# Patient Record
Sex: Male | Born: 1990 | Race: Black or African American | Hispanic: No | Marital: Married | Smoking: Never smoker
Health system: Southern US, Community
[De-identification: ages and names within clinical notes are randomized; demographics above are authoritative.]

## PROBLEM LIST (undated history)

## (undated) HISTORY — PX: HERNIA REPAIR: SHX51

---
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
## 2011-01-31 NOTE — Discharge Summary (Signed)
NAME:  Kevin Clayton, Kevin Clayton           ACCOUNT NO.:  [id number]   MEDICAL RECORD NO.:  [id number]          PATIENT TYPE:  AMB   LOCATION:  DSC                          FACILITY:  [facility]   PHYSICIAN:  Prabhakar D. Pendse, M.D.DATE OF BIRTH:  [birth year]   DATE OF ADMISSION:  10/07/2005  DATE OF DISCHARGE:  10/08/2005                                 DISCHARGE SUMMARY   REASON FOR HOSPITALIZATION:  Tachypnea, tachycardia, and hypoxia in a 20-  year-old status post umbilical hernia repair with general anesthesia and  pulmonary edema on chest x-ray postoperatively.  Ambulatory O2 saturation  are 83%.   SIGNIFICANT FINDINGS:  The patient was a direct admit from day surgery.  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  The patient is seen by Dr. Hyacinth Meeker at [facility] and is up to  date on all of his immunizations.  The patient initially had a 2 L/min nasal  cannula O2 requirement on arrival to the floor in order to keep O2  saturations greater than 92%, but was able to be weaned down to 0.5 L/min  within a few hours of arrival.  The patient was held for 23-hour observation  and had an uneventful overnight course.  In the a.m., the patient was able  to ambulate on room air with O2 saturations of 98%.  Pulmonary auscultation  was significant on arrival for clear to auscultation bilaterally, but the  patient expressed fatigue and mild shortness of breath.  The patient's  auscultatory exam in the a.m. was unchanged and clear, but the patient's  complaint of fatigue and shortness of breath had passed.  The patient had a  chest x-ray done in day surgery which was significant for pulmonary edema.  Chest x-ray was not repeated primarily based on the patient's good clinical  exam and stable O2 saturations the next morning.  The patient was discharged  home with close followup with Dr.  Levie Heritage for postop care and with Dr. Hyacinth Meeker  at [facility].   TREATMENT:  Nasal O2 and bed rest.   OPERATIONS AND PROCEDURES:  None.   FINAL DIAGNOSES:  1.  Flash pulmonary edema.  2.  Transient volume overload.  3.  Status post umbilical hernia repair with general anesthesia.   DISCHARGE MEDICATIONS AND INSTRUCTIONS:  No discharge medications.   Instructions are to return to clinic if shortness of breath returns and for  close followup with Dr. Levie Heritage for surgery and Dr. Hyacinth Meeker with [facility].  Please call for an appointment.   PENDING RESULTS AND ISSUES TO BE FOLLOWED UP:  1.  Pulmonary status.  2.  Postop care.   FOLLOW UP:  1.  Dr. Levie Heritage.  2.  Dr. Hyacinth Meeker of [facility].   DISCHARGE WEIGHT:  61.81 kg.   DISCHARGE CONDITION:  Improved.      Towana Badger, M.D.    ______________________________  Hyman Bible. Levie Heritage, M.D.    JP/MEDQ  D:  10/08/2005  T:  10/08/2005  Job:  [id number] 

## 2011-01-31 NOTE — Op Note (Signed)
NAME:  Kevin Clayton, Kevin Clayton           ACCOUNT NO.:  [id number]   MEDICAL RECORD NO.:  [id number]          PATIENT TYPE:  AMB   LOCATION:  DSC                          FACILITY:  [facility]   PHYSICIAN:  Prabhakar D. Pendse, M.D.DATE OF BIRTH:  [birth year]   DATE OF PROCEDURE:  10/07/2005  DATE OF DISCHARGE:                                 OPERATIVE REPORT   PREOPERATIVE DIAGNOSIS:  Umbilical hernia.   POSTOPERATIVE DIAGNOSIS:  Umbilical hernia.   OPERATION PERFORMED:  Repair of umbilical hernia.   SURGEON:  Prabhakar D. Levie Heritage, M.D.   ASSISTANT:  Nurse.   ANESTHESIA:  Nurse   OPERATIVE PROCEDURE:  Under satisfactory general endotracheal anesthesia  with the patient in supine position, the abdomen was thoroughly prepped and  draped in the usual manner. A curvilinear infraumbilical incision was made,  the skin and subcutaneous tissues incised. Bleeders were individually,  clamped, cut and electrocoagulated. Blunt and sharp dissection was carried  out to isolate the umbilical hernia sac. Then the neck of the sac was  opened, umbilical fascia defect was repaired in one layer with #0 Vicryl  vertical mattress sutures. Satisfactory repair was accomplished excess of  the umbilical hernia sac was excised.   Hemostasis accomplished. 1/4% Marcaine with epinephrine was injected locally  for postop analgesia. Subcutaneous tissue closed with 4-0 Vicryl, skin  closed with 4-0 Monocryl subcuticular sutures. Appropriate dressing applied.  Throughout the procedure the patient's vital signs remained stable. The  patient withstood the procedure well and was transferred to recovery room in  satisfactory general condition.           ______________________________  Hyman Bible Levie Heritage, M.D.     PDP/MEDQ  D:  10/07/2005  T:  10/07/2005  Job:  [id number]   cc:   Netta Cedars, M.D.

## 2017-03-30 ENCOUNTER — Emergency Department (HOSPITAL_COMMUNITY)
Admission: EM | Admit: 2017-03-30 | Discharge: 2017-03-30 | Disposition: A | Discharge status: Home / Self Care | Payer: BLUE CROSS/BLUE SHIELD | Attending: Emergency Medicine | Admitting: Emergency Medicine

## 2017-03-30 ENCOUNTER — Encounter (HOSPITAL_COMMUNITY): Payer: Self-pay | Admitting: *Deleted

## 2017-03-30 DIAGNOSIS — F4321 Adjustment disorder with depressed mood: Secondary | ICD-10-CM | POA: Diagnosis present

## 2017-03-30 DIAGNOSIS — Z7289 Other problems related to lifestyle: Secondary | ICD-10-CM | POA: Diagnosis not present

## 2017-03-30 LAB — COMPREHENSIVE METABOLIC PANEL
ALBUMIN: 4.6 g/dL (ref 3.5–5.0)
ALK PHOS: 46 U/L (ref 38–126)
ALT: 44 U/L (ref 17–63)
ANION GAP: 11 (ref 5–15)
AST: 26 U/L (ref 15–41)
BUN: 14 mg/dL (ref 6–20)
CALCIUM: 9.5 mg/dL (ref 8.9–10.3)
CO2: 23 mmol/L (ref 22–32)
Chloride: 105 mmol/L (ref 101–111)
Creatinine, Ser: 1.22 mg/dL (ref 0.61–1.24)
GFR calc non Af Amer: >60 mL/min (ref >60)
GLUCOSE: 96 mg/dL (ref 65–99)
POTASSIUM: 3.9 mmol/L (ref 3.5–5.1)
SODIUM: 139 mmol/L (ref 135–145)
Total Bilirubin: 0.4 mg/dL (ref 0.3–1.2)
Total Protein: 8.2 g/dL — ABNORMAL HIGH (ref 6.5–8.1)

## 2017-03-30 LAB — CBC
HEMATOCRIT: 46.6 % (ref 39.0–52.0)
Hemoglobin: 15.7 g/dL (ref 13.0–17.0)
MCH: 26.3 pg (ref 26.0–34.0)
MCHC: 33.7 g/dL (ref 30.0–36.0)
MCV: 77.9 fL — ABNORMAL LOW (ref 78.0–100.0)
Platelets: 289 10*3/uL (ref 150–400)
RBC: 5.98 MIL/uL — ABNORMAL HIGH (ref 4.22–5.81)
RDW: 14 % (ref 11.5–15.5)
WBC: 6.3 10*3/uL (ref 4.0–10.5)

## 2017-03-30 LAB — RAPID URINE DRUG SCREEN, HOSP PERFORMED
Amphetamines: NOT DETECTED
BENZODIAZEPINES: NOT DETECTED
Barbiturates: NOT DETECTED
COCAINE: NOT DETECTED
OPIATES: NOT DETECTED
Tetrahydrocannabinol: NOT DETECTED

## 2017-03-30 LAB — ACETAMINOPHEN LEVEL

## 2017-03-30 LAB — ETHANOL: Alcohol, Ethyl (B): 21 mg/dL — ABNORMAL HIGH (ref <5)

## 2017-03-30 LAB — SALICYLATE LEVEL

## 2017-03-30 MED ORDER — BACITRACIN ZINC 500 UNIT/GM EX OINT
TOPICAL_OINTMENT | Freq: Once | CUTANEOUS | Status: AC
  Administered 2017-03-30: 1 via TOPICAL
  Filled 2017-03-30: qty 28.35

## 2017-03-30 MED ORDER — IBUPROFEN 200 MG PO TABS: 600.0000 mg | ORAL_TABLET | Freq: Three times a day (TID) | ORAL | Status: DC | PRN

## 2017-03-30 MED ORDER — ALUM & MAG HYDROXIDE-SIMETH 200-200-20 MG/5ML PO SUSP: 30.0000 mL | Freq: Four times a day (QID) | ORAL | Status: DC | PRN

## 2017-03-30 MED ORDER — ONDANSETRON HCL 4 MG PO TABS: 4.0000 mg | ORAL_TABLET | Freq: Three times a day (TID) | ORAL | Status: DC | PRN

## 2017-03-30 MED ORDER — ZOLPIDEM TARTRATE 5 MG PO TABS: 5.0000 mg | ORAL_TABLET | Freq: Every evening | ORAL | Status: DC | PRN

## 2017-03-30 NOTE — ED Notes (Signed)
SBAR Report received from previous nurse. Pt received calm and visible on unit. Pt denies current SI/ HI, A/V H, depression, anxiety, or pain at this time, and appears otherwise stable and free of distress. Pt reminded of camera surveillance, q 15 min rounds, and rules of the milieu. Will continue to assess. 

## 2017-03-30 NOTE — BHH Suicide Risk Assessment (Addendum)
Suicide Risk Assessment  Discharge Assessment   [facility]   Principal Problem: Adjustment disorder with depressed mood Discharge Diagnoses:  Patient Active Problem List   Diagnosis Date Noted  . Adjustment disorder with depressed mood [F43.21] 03/30/2017    Priority: High    Total Time spent with patient: 45 minutes  Musculoskeletal: Strength & Muscle Tone: within normal limits Gait & Station: normal Patient leans: N/A  Psychiatric Specialty Exam:   Blood pressure 101/64, pulse 78, temperature 98.4 F (36.9 C), temperature source Oral, resp. rate 16, height 5\' 6"  (1.676 m), weight 77.1 kg (170 lb), SpO2 98 %.Body mass index is 27.44 kg/m.  General Appearance: Casual  Eye Contact::  Good  Speech:  Normal Rate409  Volume:  Normal  Mood:  Depressed, mild  Affect:  Congruent  Thought Process:  Coherent and Descriptions of Associations: Intact  Orientation:  Full (Time, Place, and Person)  Thought Content:  WDL and Logical  Suicidal Thoughts:  No  Homicidal Thoughts:  No  Memory:  Immediate;   Good Recent;   Good Remote;   Good  Judgement:  Fair  Insight:  Fair  Psychomotor Activity:  Normal  Concentration:  Good  Recall:  Good  Fund of Knowledge:Good  Language: Good  Akathisia:  No  Handed:  Right  AIMS (if indicated):     Assets:  Leisure Time Physical Health Resilience Social Support  Sleep:     Cognition: WNL  ADL's:  Intact   Mental Status Per Nursing Assessment::   On Admission:   26 yo male who came to the ED after getting upset and cutting himself, no stitches required.  Denies suicidal/homicidal ideations, hallucinations, or alcohol/drug abuse.  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  Evidently, he had one but it was cancelled at the last moment.  He lives with his husband, married a couple of months ago and usually discusses his problems with his husband  but did not this time.  Agreeable to counseling.  Stable for discharge.  Demographic Factors:  Male and Cardell PeachGay, lesbian, or bisexual orientation  Loss Factors: NA  Historical Factors: NA  Risk Reduction Factors:   Sense of responsibility to family, Living with another person, especially a relative and Positive social support  Continued Clinical Symptoms:  Depressed, mild  Cognitive Features That Contribute To Risk:  None    Suicide Risk:  Minimal: No identifiable suicidal ideation.  Patients presenting with no risk factors but with morbid ruminations; may be classified as minimal risk based on the severity of the depressive symptoms    Plan Of Care/Follow-up recommendations:  Activity:  as tolerated Diet:  heart healthy diet  LORD, JAMISON, NP 03/30/2017, 10:51 AM

## 2017-03-30 NOTE — BH Assessment (Signed)
BHH Assessment Progress Note  Per Thedore MinsMojeed Akintayo, MD, this pt does not require psychiatric hospitalization at this time.  [facility] with referral information for area therapists.  Discharge instructions include referrals for [facility] [place], for [facility], and for [facility].  Pt's nurse, Morrie Sheldonshley, has been notified.  Doylene Canninghomas Madigan Rosensteel, MA Triage Specialist [phone]

## 2017-03-30 NOTE — ED Provider Notes (Signed)
[facility]-EMERGENCY DEPT Provider Note   CSN: [id number] Arrival date & time: 03/30/17  0033  By signing my name below, I, Kevin Clayton, attest that this documentation has been prepared under the direction and in the presence of Gregory Barrick, Canary Brim, *. Electronically Signed: Deland Clayton, ED Scribe. 03/30/17. 12:56 AM.  History   Chief Complaint Chief Complaint  Patient presents with  . Suicidal   The history is provided by the patient. No language interpreter was used.   HPI Comments: Kevin Clayton is a 26 y.o. male who presents to the Emergency Department complaining of moderate left forearm pain s/p an episode of self-harm with associated suicidal ideations that occurred this afternoon. Per pt, his husband called EMS after this episode. The pt states that he is unsure as to why he cut himself, and reports that he would not try it again if he went home. The pt denies a h/x of hospitalization due to depression. He also denies fever.   History reviewed. No pertinent past medical history.  There are no active problems to display for this patient.   Past Surgical History:  Procedure Laterality Date  . HERNIA REPAIR         Home Medications    Prior to Admission medications   Not on File    Family History No family history on file.  Social History Social History  Substance Use Topics  . Smoking status: Never Smoker  . Smokeless tobacco: Never Used  . Alcohol use Yes     Comment: occ     Allergies   Patient has no allergy information on record.   Review of Systems Review of Systems  Psychiatric/Behavioral: Positive for self-injury and suicidal ideas.     Physical Exam Updated Vital Signs BP (!) 155/92 (BP Location: Left Arm)   Pulse (!) 109   Temp 98.7 F (37.1 C) (Oral)   Resp 20   Ht 5\' 6"  (1.676 m)   Wt 77.1 kg (170 lb)   SpO2 100%   BMI 27.44 kg/m   Physical Exam  Constitutional: He is oriented to person, place, and time. He  appears well-developed and well-nourished. No distress.  HENT:  Head: Normocephalic and atraumatic.  Right Ear: Hearing normal.  Left Ear: Hearing normal.  Nose: Nose normal.  Mouth/Throat: Oropharynx is clear and moist and mucous membranes are normal.  Eyes: Pupils are equal, round, and reactive to light. Conjunctivae and EOM are normal.  Neck: Normal range of motion. Neck supple.  Cardiovascular: Regular rhythm, S1 normal and S2 normal.  Exam reveals no gallop and no friction rub.   No murmur heard. Pulmonary/Chest: Effort normal and breath sounds normal. No respiratory distress. He exhibits no tenderness.  Abdominal: Soft. Normal appearance and bowel sounds are normal. There is no hepatosplenomegaly. There is no tenderness. There is no rebound, no guarding, no tenderness at McBurney's point and negative Murphy's sign. No hernia.  Musculoskeletal: Normal range of motion.  Neurological: He is alert and oriented to person, place, and time. He has normal strength. No cranial nerve deficit or sensory deficit. Coordination normal. GCS eye subscore is 4. GCS verbal subscore is 5. GCS motor subscore is 6.  Skin: Skin is warm, dry and intact. No rash noted. No cyanosis.  3cm superficial linear upper abrasion left forearm.   Psychiatric: His speech is delayed. He is slowed and withdrawn. He exhibits a depressed mood.  Nursing note and vitals reviewed.    ED Treatments / Results   DIAGNOSTIC  STUDIES: Oxygen Saturation is 97% on RA, adequate by my interpretation.   COORDINATION OF CARE: 12:47 AM-Discussed next steps with pt. Pt verbalized understanding and is agreeable with the plan.   Labs (all labs ordered are listed, but only abnormal results are displayed) Labs Reviewed  COMPREHENSIVE METABOLIC PANEL  ETHANOL  SALICYLATE LEVEL  ACETAMINOPHEN LEVEL  CBC  RAPID URINE DRUG SCREEN, HOSP PERFORMED    EKG  EKG Interpretation None       Radiology No results  found.  Procedures Procedures (including critical care time)  Medications Ordered in ED Medications  ibuprofen (ADVIL,MOTRIN) tablet 600 mg (not administered)  ondansetron (ZOFRAN) tablet 4 mg (not administered)  alum & mag hydroxide-simeth (MAALOX/MYLANTA) 200-200-20 MG/5ML suspension 30 mL (not administered)  zolpidem (AMBIEN) tablet 5 mg (not administered)     Initial Impression / Assessment and Plan / ED Course  I have reviewed the triage vital signs and the nursing notes.  Pertinent labs & imaging results that were available during my care of the patient were reviewed by me and considered in my medical decision making (see chart for details).     Patient presents to the emergency department for psychiatric evaluation. Patient reports stressors at home and with employment. He won't elaborate. He did cut his left forearm earlier, superficial, no repair necessary. He reports no previous history of psychiatric treatment or hospitalization. Patient reluctant to answer questions, will require psychiatric evaluation.  Final Clinical Impressions(s) / ED Diagnoses   Final diagnoses:  Depression, unspecified depression type  Self mutilating behavior    New Prescriptions New Prescriptions   No medications on file   I personally performed the services described in this documentation, which was scribed in my presence. The recorded information has been reviewed and is accurate.    Gilda CreasePollina, Debbrah Sampedro J, MD 03/30/17 [phone]

## 2017-03-30 NOTE — BH Assessment (Addendum)
Tele Assessment Note   Kevin Clayton is an 26 y.o. male, who presents voluntary and unaccompanied to [facility]. Pt was a poor historian during the assessment. Clinician asked the pt what brought you to the hospital? Pt responded, "I hurt myself, I was told I had to come to here." Clinician observed vertical cuts on pt's left forearm. Clinician asked the pt what triggered him to cut? Pt responded, "sad stuff." Pt reported, access to knives. Pt denied, SI, HI, and AVH.  Pt denied abuse. Pt's BAL was 21 at 0054. Pt reported, he was a social drinker. Pt's UDS is negative. Pt denied being linked to OPT resources (medication management and/or counseling.) Pt reported, a previous inpatient admissions in 2010 to a facility in [place], [place] however it was a misunderstanding and he was discharged the next day. Pt did not disclose, the reason for his admission.   Pt presents quiet/awake in scrubs with logical/coherent speech. Pt's eye contact was poor. Pt's mood was irritable/anxious. Pt's affect was flat. Pt's judgement was unimpaired. Pt's concentration was normal. Pt's insight and impulse control are poor. Pt was oriented x4 (day, year, city and state.) Pt reported, if discharged from [facility] he could contract for safety. Pt reported, if inpatient treatment was recommended he would sign-in voluntarily.   Diagnosis: Deferred  Past Medical History: History reviewed. No pertinent past medical history.  Past Surgical History:  Procedure Laterality Date  . HERNIA REPAIR      Family History: No family history on file.  Social History:  reports that he has never smoked. He has never used smokeless tobacco. He reports that he drinks alcohol. He reports that he does not use drugs.  Additional Social History:  Alcohol / Drug Use Pain Medications: See MAR Prescriptions: See MAR Over the Counter: See MAR History of alcohol / drug use?:  (Pending)  CIWA: CIWA-Ar BP: (!) 155/92 Pulse Rate: (!) 109 COWS:     PATIENT STRENGTHS: (choose at least two) Average or above average intelligence Supportive family/friends  Allergies:  Allergies  Allergen Reactions  . Penicillins Hives    Has patient had a PCN reaction causing immediate rash, facial/tongue/throat swelling, SOB or lightheadedness with hypotension: yes Has patient had a PCN reaction causing severe rash involving mucus membranes or skin necrosis: no Has patient had a PCN reaction that required hospitalization: no Has patient had a PCN reaction occurring within the last 10 years: no If all of the above answers are "NO", then may proceed with Cephalosporin use.     Home Medications:  (Not in a hospital admission)  OB/GYN Status:  No LMP for male patient.  General Assessment Data [facility] of Assessment: [facility] ED TTS Assessment: In system Is this a Tele or Face-to-Face Assessment?: Face-to-Face Is this an Initial Assessment or a Re-assessment for this encounter?: Initial Assessment Marital status: Married Living Arrangements: Spouse/significant other Can pt return to current living arrangement?: Yes Admission Status: Voluntary Is patient capable of signing voluntary admission?: Yes Referral Source: Self/Family/Friend Insurance type: [employer]     Crisis Care Plan Living Arrangements: Spouse/significant other Legal Guardian: Other: (Self) Name of Psychiatrist: NA Name of Therapist: NA  Education Status Is patient currently in school?: No Current Grade: NA  Highest grade of school patient has completed: Pt reported, completing law school.  Name of school: NA Contact person: NA  Risk to self with the past 6 months Suicidal Ideation: No (Pt denies.) Has patient been a risk to self within the past 6 months prior  to admission? : No Suicidal Intent: No Has patient had any suicidal intent within the past 6 months prior to admission? : No Is patient at risk for suicide?: Yes Suicidal Plan?: No Has patient had any suicidal plan  within the past 6 months prior to admission? : No Access to Means: No What has been your use of drugs/alcohol within the last 12 months?: Pt reported, he is a social drinker.  Previous Attempts/Gestures: No How many times?: 0 Other Self Harm Risks: Cutting. Triggers for Past Attempts: None known Intentional Self Injurious Behavior: Cutting Comment - Self Injurious Behavior: Pt cut left forarm with a knife. Family Suicide History: No Recent stressful life event(s): Other (Comment) (Pt reported, "sad stuff." ) Persecutory voices/beliefs?: No Depression: Yes Depression Symptoms: Loss of interest in usual pleasures (crying ) Substance abuse history and/or treatment for substance abuse?: No Suicide prevention information given to non-admitted patients: Not applicable  Risk to Others within the past 6 months Homicidal Ideation: No (Pt denies. ) Does patient have any lifetime risk of violence toward others beyond the six months prior to admission? : No Thoughts of Harm to Others: No Current Homicidal Intent: No Current Homicidal Plan: No Access to Homicidal Means: No Identified Victim: NA History of harm to others?: No Assessment of Violence: None Noted Violent Behavior Description: NA Does patient have access to weapons?:  (knives.) Criminal Charges Pending?: No Does patient have a court date: No Is patient on probation?: No  Psychosis Hallucinations: None noted Delusions: None noted  Mental Status Report Appearance/Hygiene: In scrubs Eye Contact: Poor Motor Activity: Unremarkable Speech: Logical/coherent Level of Consciousness: Quiet/awake Mood: Irritable, Anxious Affect: Flat Anxiety Level: Moderate Thought Processes: Coherent, Relevant Judgement: Unimpaired Orientation: Other (Comment) (day, year, city and state.) Obsessive Compulsive Thoughts/Behaviors: None  Cognitive Functioning Concentration: Normal Memory: Recent Intact IQ: Average Insight: Poor Impulse  Control: Poor Appetite: Fair Weight Loss: 0 Weight Gain: 0 Sleep: No Change Total Hours of Sleep:  (Pt reported, 8-10 hours. ) Vegetative Symptoms: None  ADLScreening [facility]) Patient's cognitive ability adequate to safely complete daily activities?: Yes Patient able to express need for assistance with ADLs?: Yes Independently performs ADLs?: Yes (appropriate for developmental age)  Prior Inpatient Therapy Prior Inpatient Therapy: Yes Prior Therapy Dates: 2010. Prior Therapy Facilty/Provider(s): Facility in [place], [place] Reason for Treatment: Pt reported, it was a misunderstanding.   Prior Outpatient Therapy Prior Outpatient Therapy: No Prior Therapy Dates: NA Prior Therapy Facilty/Provider(s): NA Reason for Treatment: NA Does patient have an ACCT team?: No Does patient have Intensive In-House Services?  : No Does patient have [facility] services? : No Does patient have P4CC services?: Unknown  ADL Screening (condition at time of admission) Patient's cognitive ability adequate to safely complete daily activities?: Yes Is the patient deaf or have difficulty hearing?: No Does the patient have difficulty seeing, even when wearing glasses/contacts?: Yes Does the patient have difficulty concentrating, remembering, or making decisions?: Yes Patient able to express need for assistance with ADLs?: Yes Does the patient have difficulty dressing or bathing?: No Independently performs ADLs?: Yes (appropriate for developmental age) Does the patient have difficulty walking or climbing stairs?: No Weakness of Legs: None Weakness of Arms/Hands: None       Abuse/Neglect Assessment (Assessment to be complete while patient is alone) Physical Abuse: Denies (Pt denies. ) Verbal Abuse: Denies (Pt denies. ) Sexual Abuse: Denies (Pt denies. ) Exploitation of patient/patient's resources: Denies (Pt denies. ) Self-Neglect: Denies (Pt denies.)  Advance Directives (For  Healthcare) Does Patient Have a Medical Advance Directive?: No Would patient like information on creating a medical advance directive?: No - Patient declined    Additional Information 1:1 In Past 12 Months?: No CIRT Risk: No Elopement Risk: No Does patient have medical clearance?: Yes     Disposition: Nira ConnJason Berry, NP recommends inpatient treatment. Disposition discussed with Dr. Blinda LeatherwoodPollina and Consuella LoseElaine, RN. TTS to seek placement.   Disposition Initial Assessment Completed for this Encounter: Yes Disposition of Patient: Inpatient treatment program Type of inpatient treatment program: Adult  Redmond Pullingreylese D Rochell Puett 03/30/2017 2:23 AM   Redmond Pullingreylese D Cassius Cullinane, MS, [facility], [facility] Triage Specialist [phone]

## 2017-03-30 NOTE — ED Triage Notes (Signed)
Per [facility], pt having issues with work.  Sig other unable to get him out of the BR, dog was locked up.  Sig other convinced pt to come to ED.  Pt presents with superficial cuts to LFA.

## 2017-03-30 NOTE — ED Notes (Signed)
TTS assessment in progress. 

## 2017-03-30 NOTE — Discharge Instructions (Signed)
For your behavioral health needs, you are advised to follow up with an outpatient therapist.  Contact one of the following practices to ask about scheduling an intake appointment:       [facility]      [address]      [place] [address]      [phone]       [facility]      [address]      [place], [place] [address]      [phone]       [facility]      [address]      [place], [place] [address]      [phone]

## 2017-03-30 NOTE — ED Notes (Addendum)
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  I also work @ [employer].  Currently living with my husband.  I have a friend that doesn't want to be my friend anymore.  I have never done anything like this before.  I did drink wine, vodka and rum but I don't know how much."

## 2017-03-30 NOTE — ED Notes (Addendum)
Pt's LFA washed with soap & water.

## 2017-03-30 NOTE — ED Notes (Signed)
Bed: WA27 Expected date:  Expected time:  Means of arrival:  Comments: 

## 2017-03-30 NOTE — ED Notes (Signed)
Pt d/c home per MD order. Discharge summary reviewed with pt. Pt verbalizes understanding. Pt denies SI/HI/AVH. Pt signed for personal property and property returned. Pt signed e-signature. Discharged home with husband. Ambulatory off unit.

## 2021-03-06 ENCOUNTER — Emergency Department (HOSPITAL_COMMUNITY): Payer: No Typology Code available for payment source

## 2021-03-06 ENCOUNTER — Other Ambulatory Visit: Payer: Self-pay

## 2021-03-06 ENCOUNTER — Encounter (HOSPITAL_COMMUNITY): Payer: Self-pay | Admitting: *Deleted

## 2021-03-06 ENCOUNTER — Emergency Department (HOSPITAL_COMMUNITY)
Admission: EM | Admit: 2021-03-06 | Discharge: 2021-03-06 | Disposition: A | Discharge status: Home / Self Care | Payer: No Typology Code available for payment source | Attending: Emergency Medicine | Admitting: Emergency Medicine

## 2021-03-06 DIAGNOSIS — R42 Dizziness and giddiness: Secondary | ICD-10-CM | POA: Insufficient documentation

## 2021-03-06 DIAGNOSIS — R519 Headache, unspecified: Secondary | ICD-10-CM | POA: Insufficient documentation

## 2021-03-06 DIAGNOSIS — R112 Nausea with vomiting, unspecified: Secondary | ICD-10-CM | POA: Insufficient documentation

## 2021-03-06 DIAGNOSIS — R0689 Other abnormalities of breathing: Secondary | ICD-10-CM | POA: Insufficient documentation

## 2021-03-06 LAB — BASIC METABOLIC PANEL
Anion gap: 13 (ref 5–15)
BUN: 15 mg/dL (ref 6–20)
CO2: 20 mmol/L — ABNORMAL LOW (ref 22–32)
Calcium: 9.5 mg/dL (ref 8.9–10.3)
Chloride: 105 mmol/L (ref 98–111)
Creatinine, Ser: 1.19 mg/dL (ref 0.61–1.24)
GFR, Estimated: >60 mL/min (ref >60)
Glucose, Bld: 136 mg/dL — ABNORMAL HIGH (ref 70–99)
Potassium: 3.6 mmol/L (ref 3.5–5.1)
Sodium: 138 mmol/L (ref 135–145)

## 2021-03-06 LAB — TROPONIN I (HIGH SENSITIVITY): Troponin I (High Sensitivity): 5 ng/L (ref <18)

## 2021-03-06 LAB — CBC
HCT: 49 % (ref 39.0–52.0)
Hemoglobin: 15.7 g/dL (ref 13.0–17.0)
MCH: 26 pg (ref 26.0–34.0)
MCHC: 32 g/dL (ref 30.0–36.0)
MCV: 81.1 fL (ref 80.0–100.0)
Platelets: 382 10*3/uL (ref 150–400)
RBC: 6.04 MIL/uL — ABNORMAL HIGH (ref 4.22–5.81)
RDW: 14.1 % (ref 11.5–15.5)
WBC: 7.5 10*3/uL (ref 4.0–10.5)
nRBC: 0 % (ref 0.0–0.2)

## 2021-03-06 LAB — CBG MONITORING, ED: Glucose-Capillary: 145 mg/dL — ABNORMAL HIGH (ref 70–99)

## 2021-03-06 MED ORDER — SODIUM CHLORIDE 0.9 % IV BOLUS
1000.0000 mL | Freq: Once | INTRAVENOUS | Status: AC
  Administered 2021-03-06: 1000 mL via INTRAVENOUS

## 2021-03-06 MED ORDER — ONDANSETRON 4 MG PO TBDP
4.0000 mg | ORAL_TABLET | Freq: Once | ORAL | Status: AC | PRN
  Administered 2021-03-06: 4 mg via ORAL
  Filled 2021-03-06: qty 1

## 2021-03-06 MED ORDER — MECLIZINE HCL 12.5 MG PO TABS: 12.5000 mg | ORAL_TABLET | Freq: Three times a day (TID) | ORAL | 0 refills | Status: DC | PRN

## 2021-03-06 MED ORDER — IOHEXOL 350 MG/ML SOLN
75.0000 mL | Freq: Once | INTRAVENOUS | Status: AC | PRN
  Administered 2021-03-06: 75 mL via INTRAVENOUS

## 2021-03-06 MED ORDER — GADOBUTROL 1 MMOL/ML IV SOLN
8.0000 mL | Freq: Once | INTRAVENOUS | Status: AC | PRN
  Administered 2021-03-06: 8 mL via INTRAVENOUS

## 2021-03-06 MED ORDER — LORAZEPAM 1 MG PO TABS
0.5000 mg | ORAL_TABLET | Freq: Once | ORAL | Status: AC
  Administered 2021-03-06: 0.5 mg via ORAL
  Filled 2021-03-06: qty 1

## 2021-03-06 MED ORDER — DEXAMETHASONE SODIUM PHOSPHATE 10 MG/ML IJ SOLN
8.0000 mg | Freq: Once | INTRAMUSCULAR | Status: AC
  Administered 2021-03-06: 8 mg via INTRAVENOUS
  Filled 2021-03-06: qty 1

## 2021-03-06 MED ORDER — KETOROLAC TROMETHAMINE 15 MG/ML IJ SOLN
15.0000 mg | Freq: Once | INTRAMUSCULAR | Status: AC
  Administered 2021-03-06: 15 mg via INTRAVENOUS
  Filled 2021-03-06: qty 1

## 2021-03-06 MED ORDER — MECLIZINE HCL 25 MG PO TABS
25.0000 mg | ORAL_TABLET | Freq: Once | ORAL | Status: AC
  Administered 2021-03-06: 25 mg via ORAL
  Filled 2021-03-06: qty 1

## 2021-03-06 MED ORDER — METOCLOPRAMIDE HCL 5 MG/ML IJ SOLN
5.0000 mg | Freq: Once | INTRAMUSCULAR | Status: AC
  Administered 2021-03-06: 5 mg via INTRAVENOUS
  Filled 2021-03-06: qty 2

## 2021-03-06 MED ORDER — DIPHENHYDRAMINE HCL 12.5 MG/5ML PO ELIX
25.0000 mg | ORAL_SOLUTION | Freq: Once | ORAL | Status: AC
  Administered 2021-03-06: 25 mg via ORAL
  Filled 2021-03-06: qty 10

## 2021-03-06 NOTE — ED Notes (Signed)
Pt ambulated to the bathroom on his own power, gait even and steady. Pt reported increased headache with ambulation that resolved once pt was back on stretcher. Pt denied dizziness or vertigo symptoms while ambulating

## 2021-03-06 NOTE — ED Triage Notes (Signed)
Pt reports feeling fine last night, woke up with dizziness and feels like the room is spinning. Vomiting at triage. No neuro deficits noted.

## 2021-03-06 NOTE — ED Notes (Signed)
Pt not in hallway bed at this time

## 2021-03-06 NOTE — Discharge Instructions (Addendum)
-  Prescription sent to pharmacy for meclizine.  This is a medicine used to treat dizziness caused by vertigo.  Take as prescribed if needed.  Referral sent to neurology for follow-up.  They should be calling you to schedule an appointment within a week.  If you do not hear from them you can call the office number and schedule ER hospital follow-up visit.  Return to the ER for any new or worsening symptoms.

## 2021-03-06 NOTE — ED Notes (Signed)
Discharge instructions including prescription and follow up care discussed with pt. Pt verbalized understanding with no questions at this time. Pt to go home with s/o.

## 2021-03-06 NOTE — ED Provider Notes (Addendum)
[facility] EMERGENCY DEPARTMENT Provider Note   CSN: [id number] Arrival date & time: 03/06/21  1019     History Chief Complaint  Patient presents with   Dizziness   Emesis    CURRIE DENNIN is a 30 y.o. male with noncontributory past medical history presenting to emergency department today with chief complaint of dizziness and emesis x1 day.  Patient states he woke up around 2 AM and felt dizzy although went back to sleep.  When he woke up this morning to start the day he got out of bed and states he felt like the room was spinning.  He was nauseous and had a headache.  He states his headache was located in his forehead and was severe.  He denies any associated neck pain or stiffness.  He admits to an episode of emesis and the headache improved so he thought he was feeling better and try to drive himself to work.  He states once he got to work he continued to have severe dizziness, nausea, emesis and headache  He estimates 7-8 episodes of nonbilious emesis.  He did notice a small blood clot in his vomit.  Patient denies any recent alcohol intake in the last x1 month.  He denies being an everyday drinker.  He tried taking over-the-counter nausea medicine without any symptom improvement and was feeling so terrible that he came to the ER.  Patient was given Zofran in triage and states he has not had any emesis since then.  His headache has improved and is very mild now.  He admits to having COVID x2-1/2 weeks ago.  He states his symptoms were very mild at that time and he only had nasal congestion and sneezing.  Patient admits to being under a lot of stress recently.  He and spouse are currently trying to buy a home, he has been busy at work since returning from being out because of COVID.  He admits to slightly decreased fluid intake over the last several days and significantly decreased meals because he has been too busy. He denies fever, visual changes, chest pain, abdominal  pain, hemoptysis, hematemesis, fall or head injury, numbness, tingling, weakness. Patient admits to history of headaches however states it felt different today than a typical headache for him.     History reviewed. No pertinent past medical history.  Patient Active Problem List   Diagnosis Date Noted   Adjustment disorder with depressed mood 03/30/2017    Past Surgical History:  Procedure Laterality Date   HERNIA REPAIR         History reviewed. No pertinent family history.  Social History   Tobacco Use   Smoking status: Never   Smokeless tobacco: Never  Substance Use Topics   Alcohol use: Yes    Comment: occ   Drug use: No    Home Medications Prior to Admission medications   Medication Sig Start Date End Date Taking? Authorizing Provider  loratadine (CLARITIN) 10 MG tablet Take 10 mg by mouth daily as needed for allergies.   Yes [provider]  meclizine (ANTIVERT) 12.5 MG tablet Take 1 tablet (12.5 mg total) by mouth 3 (three) times daily as needed for dizziness. 03/06/21  Yes Namon Cirri E, PA-C    Allergies    Penicillins  Review of Systems   Review of Systems  Gastrointestinal:  Positive for nausea and vomiting.  Neurological:  Positive for dizziness and headaches.  All other systems reviewed and are negative.  Physical  Exam Updated Vital Signs BP 134/85 (BP Location: Left Arm)   Pulse 89   Temp 98.2 F (36.8 C) (Oral)   Resp 16   SpO2 99%   Physical Exam Vitals and nursing note reviewed.  Constitutional:      General: He is not in acute distress.    Appearance: He is not ill-appearing.  HENT:     Head: Normocephalic and atraumatic.     Comments: No sinus or temporal tenderness.     Right Ear: Tympanic membrane and external ear normal.     Left Ear: Tympanic membrane and external ear normal.     Nose: Nose normal.     Mouth/Throat:     Mouth: Mucous membranes are moist.     Pharynx: Oropharynx is clear.  Eyes:     General:  No scleral icterus.       Right eye: No discharge.        Left eye: No discharge.     Extraocular Movements: Extraocular movements intact.     Conjunctiva/sclera: Conjunctivae normal.     Pupils: Pupils are equal, round, and reactive to light.     Comments: No nystagmus  Neck:     Vascular: No JVD.     Comments: Full ROM intact without spinous process TTP. No bony stepoffs or deformities, no paraspinous muscle TTP or muscle spasms. No rigidity or meningeal signs. No bruising, erythema, or swelling.   Cardiovascular:     Rate and Rhythm: Normal rate and regular rhythm.     Pulses: Normal pulses.          Radial pulses are 2+ on the right side and 2+ on the left side.     Heart sounds: Normal heart sounds.  Pulmonary:     Comments: Lungs clear to auscultation in all fields. Symmetric chest rise. No wheezing, rales, or rhonchi. Abdominal:     Tenderness: There is no right CVA tenderness or left CVA tenderness.     Comments: Abdomen is soft, non-distended, and non-tender in all quadrants. No rigidity, no guarding. No peritoneal signs.  Musculoskeletal:        General: Normal range of motion.     Cervical back: Normal range of motion.  Skin:    General: Skin is warm and dry.     Capillary Refill: Capillary refill takes less than 2 seconds.  Neurological:     Mental Status: He is oriented to person, place, and time.     GCS: GCS eye subscore is 4. GCS verbal subscore is 5. GCS motor subscore is 6.     Comments: Speech is clear and goal oriented, follows commands CN III-XII intact, no facial droop Normal strength in upper and lower extremities bilaterally including dorsiflexion and plantar flexion, strong and equal grip strength Sensation normal to light and sharp touch Moves extremities without ataxia, coordination intact Normal finger to nose and rapid alternating movements Normal gait and balance  Psychiatric:        Behavior: Behavior normal.    ED Results / Procedures /  Treatments   Labs (all labs ordered are listed, but only abnormal results are displayed) Labs Reviewed  BASIC METABOLIC PANEL - Abnormal; Notable for the following components:      Result Value   CO2 20 (*)    Glucose, Bld 136 (*)    All other components within normal limits  CBC - Abnormal; Notable for the following components:   RBC 6.04 (*)    All other  components within normal limits  CBG MONITORING, ED - Abnormal; Notable for the following components:   Glucose-Capillary 145 (*)    All other components within normal limits  URINALYSIS, ROUTINE W REFLEX MICROSCOPIC  TROPONIN I (HIGH SENSITIVITY)    EKG EKG Interpretation  Date/Time:  Wednesday March 06 2021 10:24:46 EDT Ventricular Rate:  105 PR Interval:  142 QRS Duration: 84 QT Interval:  350 QTC Calculation: 462 R Axis:   42 Text Interpretation: Sinus tachycardia Nonspecific T wave inversions lateral leads, no recent prior tracing for comparison No STEMI Confirmed by Alvester Chou [phone]) on 03/06/2021 4:33:29 PM  Radiology CT Angio Head W or Wo Contrast  Result Date: 03/06/2021 CLINICAL DATA:  Dizziness. EXAM: CT ANGIOGRAPHY HEAD AND NECK TECHNIQUE: Multidetector CT imaging of the head and neck was performed using the standard protocol during bolus administration of intravenous contrast. Multiplanar CT image reconstructions and MIPs were obtained to evaluate the vascular anatomy. Carotid stenosis measurements (when applicable) are obtained utilizing NASCET criteria, using the distal internal carotid diameter as the denominator. CONTRAST:  38mL OMNIPAQUE IOHEXOL 350 MG/ML SOLN COMPARISON:  None. FINDINGS: CT HEAD FINDINGS Brain: No evidence of acute large vascular territory infarction, hemorrhage, hydrocephalus, extra-axial collection or mass lesion/mass effect. Vascular: See below. Skull: No acute fracture. Nonspecific scalp soft tissue nodule along the posterior right paramidline vertex. Sinuses: Visualized sinuses are  largely clear. Orbits: No acute finding. Review of the MIP images confirms the above findings CTA NECK FINDINGS Evaluation is limited due to pooling of contrast in the left subclavian vein and reflux of contrast in the left neck with associated streak artifact. Suspected narrowing of the subclavian vein at midline. Aortic arch: Great vessel origins appear patent. Evaluation is limited due to streak artifact from pooled contrast within adjacent veins. Right carotid system: No evidence of dissection, stenosis (50% or greater) or occlusion. Left carotid system: Limited evaluation for the reasons described above. No visible hemodynamically significant (greater than 50%) stenosis. Vertebral arteries: No visible significant (greater than 50%) stenosis. Skeleton: No acute abnormality. Reversal of the normal cervical lordosis. Other neck: No acute abnormality. Upper chest: Visualized lung apices are clear. Review of the MIP images confirms the above findings CTA HEAD FINDINGS Anterior circulation: Bilateral intracranial ICAs are patent. Bilateral MCAs and ACAs are patent without evidence of proximal hemodynamically significant stenosis. Distal vascular evaluation is limited due to venous contamination. Posterior circulation: Bilateral intradural vertebral arteries are patent. The basilar artery and bilateral posterior cerebral arteries are patent proximally. Right fetal like PCA with posterior communicating artery. No proximal hemodynamically significant stenosis. Limited evaluation of the distal PCAs due to venous contamination. Venous sinuses: There is apparent filling defect within the right transverse and sigmoid sinuses and right jugular bulb, which is suboptimally evaluated but suspicious for dural venous sinus thrombosis. Review of the MIP images confirms the above findings IMPRESSION: 1. Apparent filling defect within the right transverse and sigmoid dural venous sinuses and right jugular bulb. While potentially  artifactual on this arterially timed study, findings are concerning for dural venous sinus thrombosis and warrant MRI with and without contrast to further evaluate. 2. No evidence of large vessel occlusion or proximal hemodynamically significant stenosis in the head or neck with limited evaluation due to venous contamination and reflux of venous contrast in the left neck. Findings discussed with at [place], [place] via telephone at 5:51 p.m. Electronically Signed   By: Feliberto Harts MD   On: 03/06/2021 17:58   CT Angio Neck W and/or  Wo Contrast  Result Date: 03/06/2021 CLINICAL DATA:  Dizziness. EXAM: CT ANGIOGRAPHY HEAD AND NECK TECHNIQUE: Multidetector CT imaging of the head and neck was performed using the standard protocol during bolus administration of intravenous contrast. Multiplanar CT image reconstructions and MIPs were obtained to evaluate the vascular anatomy. Carotid stenosis measurements (when applicable) are obtained utilizing NASCET criteria, using the distal internal carotid diameter as the denominator. CONTRAST:  75mL OMNIPAQUE IOHEXOL 350 MG/ML SOLN COMPARISON:  None. FINDINGS: CT HEAD FINDINGS Brain: No evidence of acute large vascular territory infarction, hemorrhage, hydrocephalus, extra-axial collection or mass lesion/mass effect. Vascular: See below. Skull: No acute fracture. Nonspecific scalp soft tissue nodule along the posterior right paramidline vertex. Sinuses: Visualized sinuses are largely clear. Orbits: No acute finding. Review of the MIP images confirms the above findings CTA NECK FINDINGS Evaluation is limited due to pooling of contrast in the left subclavian vein and reflux of contrast in the left neck with associated streak artifact. Suspected narrowing of the subclavian vein at midline. Aortic arch: Great vessel origins appear patent. Evaluation is limited due to streak artifact from pooled contrast within adjacent veins. Right carotid system: No evidence of dissection,  stenosis (50% or greater) or occlusion. Left carotid system: Limited evaluation for the reasons described above. No visible hemodynamically significant (greater than 50%) stenosis. Vertebral arteries: No visible significant (greater than 50%) stenosis. Skeleton: No acute abnormality. Reversal of the normal cervical lordosis. Other neck: No acute abnormality. Upper chest: Visualized lung apices are clear. Review of the MIP images confirms the above findings CTA HEAD FINDINGS Anterior circulation: Bilateral intracranial ICAs are patent. Bilateral MCAs and ACAs are patent without evidence of proximal hemodynamically significant stenosis. Distal vascular evaluation is limited due to venous contamination. Posterior circulation: Bilateral intradural vertebral arteries are patent. The basilar artery and bilateral posterior cerebral arteries are patent proximally. Right fetal like PCA with posterior communicating artery. No proximal hemodynamically significant stenosis. Limited evaluation of the distal PCAs due to venous contamination. Venous sinuses: There is apparent filling defect within the right transverse and sigmoid sinuses and right jugular bulb, which is suboptimally evaluated but suspicious for dural venous sinus thrombosis. Review of the MIP images confirms the above findings IMPRESSION: 1. Apparent filling defect within the right transverse and sigmoid dural venous sinuses and right jugular bulb. While potentially artifactual on this arterially timed study, findings are concerning for dural venous sinus thrombosis and warrant MRI with and without contrast to further evaluate. 2. No evidence of large vessel occlusion or proximal hemodynamically significant stenosis in the head or neck with limited evaluation due to venous contamination and reflux of venous contrast in the left neck. Findings discussed with at [place], [place] via telephone at 5:51 p.m. Electronically Signed   By: Feliberto Harts MD   On:  03/06/2021 17:58   MR Brain W and Wo Contrast  Result Date: 03/06/2021 CLINICAL DATA:  Dizziness. Possible dural venous sinus thrombosis seen on CTA. EXAM: MRI HEAD WITHOUT AND WITH CONTRAST MRV HEAD WITHOUT CONTRAST TECHNIQUE: Multiplanar, multiecho pulse sequences of the brain and surrounding structures were obtained without and with intravenous contrast. Angiographic images of the intracranial venous structures were obtained using MRV technique without intravenous contrast. CONTRAST:  8mL GADAVIST GADOBUTROL 1 MMOL/ML IV SOLN COMPARISON:  Same day CT exams. FINDINGS: MRI HEAD WITHOUT CONTRAST Brain: No acute infarction, hemorrhage, hydrocephalus, extra-axial collection or mass lesion. Vascular: Major arterial flow voids are maintained at the skull base. Skull and upper cervical spine: No focal marrow replacing  lesion. Diffuse T1 hypointensity of the visualized cervical spine marrow. Sinuses/Orbits: Mild mucosal thickening.  Unremarkable orbits. Other: No mastoid effusions.  Bilateral parotid lymph nodes. MR VENOGRAM WITHOUT CONTRAST When correlating with the postcontrast MRI and MRV, no evidence of dural venous sinus thrombosis. Small left transverse and straight sinuses. IMPRESSION: 1. No evidence of acute intracranial abnormality. 2. No dural venous sinus thrombosis. Findings seen on same day CTA were likely artifactual. 3. Diffuse T1 hypointensity of the visualized cervical spine marrow without discrete marrow replacing lesion. Findings are nonspecific but can be seen in the setting of chronic anemia, hypoxia (such as in smokers), obesity, or underlying lymphoproliferative disorder. Electronically Signed   By: Feliberto HartsFrederick S Jones MD   On: 03/06/2021 20:59   MR MRV HEAD W WO CONTRAST  Result Date: 03/06/2021 CLINICAL DATA:  Dizziness. Possible dural venous sinus thrombosis seen on CTA. EXAM: MRI HEAD WITHOUT AND WITH CONTRAST MRV HEAD WITHOUT CONTRAST TECHNIQUE: Multiplanar, multiecho pulse sequences of  the brain and surrounding structures were obtained without and with intravenous contrast. Angiographic images of the intracranial venous structures were obtained using MRV technique without intravenous contrast. CONTRAST:  8mL GADAVIST GADOBUTROL 1 MMOL/ML IV SOLN COMPARISON:  Same day CT exams. FINDINGS: MRI HEAD WITHOUT CONTRAST Brain: No acute infarction, hemorrhage, hydrocephalus, extra-axial collection or mass lesion. Vascular: Major arterial flow voids are maintained at the skull base. Skull and upper cervical spine: No focal marrow replacing lesion. Diffuse T1 hypointensity of the visualized cervical spine marrow. Sinuses/Orbits: Mild mucosal thickening.  Unremarkable orbits. Other: No mastoid effusions.  Bilateral parotid lymph nodes. MR VENOGRAM WITHOUT CONTRAST When correlating with the postcontrast MRI and MRV, no evidence of dural venous sinus thrombosis. Small left transverse and straight sinuses. IMPRESSION: 1. No evidence of acute intracranial abnormality. 2. No dural venous sinus thrombosis. Findings seen on same day CTA were likely artifactual. 3. Diffuse T1 hypointensity of the visualized cervical spine marrow without discrete marrow replacing lesion. Findings are nonspecific but can be seen in the setting of chronic anemia, hypoxia (such as in smokers), obesity, or underlying lymphoproliferative disorder. Electronically Signed   By: Feliberto HartsFrederick S Jones MD   On: 03/06/2021 20:59    Procedures Procedures   Medications Ordered in ED Medications  diphenhydrAMINE (BENADRYL) 12.5 MG/5ML elixir 25 mg (has no administration in time range)  metoCLOPramide (REGLAN) injection 5 mg (has no administration in time range)  ketorolac (TORADOL) 15 MG/ML injection 15 mg (has no administration in time range)  dexamethasone (DECADRON) injection 8 mg (has no administration in time range)  ondansetron (ZOFRAN-ODT) disintegrating tablet 4 mg (4 mg Oral Given 03/06/21 1032)  sodium chloride 0.9 % bolus 1,000 mL  (0 mLs Intravenous Stopped 03/06/21 1902)  meclizine (ANTIVERT) tablet 25 mg (25 mg Oral Given 03/06/21 1645)  iohexol (OMNIPAQUE) 350 MG/ML injection 75 mL (75 mLs Intravenous Contrast Given 03/06/21 1725)  LORazepam (ATIVAN) tablet 0.5 mg (0.5 mg Oral Given 03/06/21 1907)  gadobutrol (GADAVIST) 1 MMOL/ML injection 8 mL (8 mLs Intravenous Contrast Given 03/06/21 2006)    ED Course  I have reviewed the triage vital signs and the nursing notes.  Pertinent labs & imaging results that were available during my care of the patient were reviewed by me and considered in my medical decision making (see chart for details).  Clinical Course as of 03/06/21 2241  Wed Mar 06, 2021  1646 [age] male w/ hx of Covid 2.5 weeks ago (tested positive, mild URI symptoms) presenting to the ED with nausea and  vertigo.  He reports he woke up earlier this morning with vertigo, room spinning, nausea.  He developed gradual onset of frontal headache.  He tried to go to work but had very persistent and severe vertigo, vomited several times.  He also had worsening frontal headache.  He felt lightheaded.  He denies any chest pain, chest pressure, cough, congestion.  Reports he did have COVID 2-1/2 weeks ago but felt he had recovered completely from that.  He has no prior history of vertigo, complex migraines, or family history of brain aneurysms.  He has no other medical issues aside from allergies which he takes Claritin for.  He does not use recreational drugs.  On exam he is well-appearing.  His vitals are within normal limits.  He does not have vertigo I can elicit on exam.  He is not actively having vertigo symptoms.  His neurological exam is benign.  His labs do show some elevation of creatinine to the upper levels of normal at 1.2.  CBC is unremarkable.  He reports his nausea improved with Zofran.  At this point I think be reasonable to give him a liter of fluid as well as some meclizine for peripheral vertigo. [MT]  1648 We  discussed the differential which would include BPPV triggered by recent viral illness versus migraine versus sinusitis (his headache is near the frontal sinuses) versus more rare dangerous complication such as peripheral stroke or subarachnoid hemorrhage.  I think these ladders are less likely given that his headache is resolved.  We discussed the options of watchful waiting at home and return to the ED for worsening symptoms versus obtaining a CTA at this time.  He would prefer to do the CTA now.  I think this is reasonable.  The scan is ordered. [MT]  1850 CT scan reviewed with neurology as well as the patient.  They are concerning for filling defect suggestive of sinus venous thrombosis.  Her neurologist has recommended MR imaging of the brain with and without contrast as well as MRV of the brain.  I reassessed the patient, he reports his headache is minimal now, and he has no active vertigo or other symptoms on exam.  We'll proceed with neuroimaging at this time. [MT]  2116   IMPRESSION: 1. No evidence of acute intracranial abnormality. 2. No dural venous sinus thrombosis. Findings seen on same day CTA were likely artifactual. 3. Diffuse T1 hypointensity of the visualized cervical spine marrow without discrete marrow replacing lesion. Findings are nonspecific but can be seen in the setting of chronic anemia, hypoxia (such as in smokers), obesity, or underlying lymphoproliferative disorder. [MT]  2119 Discussed MR C spine finding with neurologist who feels this is likely an incidental finding - with no acute ischemic findings or sinus occlusion, this may be a complex migraine. [MT]  2119 Patient was reassessed and reports very minimal symptoms, no vertigo, improvement of his headache since arrival.  Okay for discharge [MT]    Clinical Course User Index [MT] Trifan, Kermit Balo, MD   MDM Rules/Calculators/A&P                          History provided by patient with additional history obtained  from chart review.    Patient presenting with dizziness, nausea, headache, emesis x 1 day. He has no history of migraines or vertigo. Did have recent URI symptoms with covid x 2.5 weeks ago. Patient had prolonged wait in the lobby of 6 hours  due to high volume. He was given zofran in triage and work up initiated with basic labs and EKG. He denied any chest pain then and to me on my exam. EKG shows nonspecific T wave inversions lateral leads with no priors to compare. He has a normal neuro exam and symptoms have improved by the time of my exam. CBC unremarkable.  BMP with bicarb of 20, creatinine at the upper limits of normal at 1.19, normal BUN.  Normal anion gap.  Glucose was 145 in triage.  With his EKG showing T wave inversions troponin added onto the work-up.  Engaged in shared decision-making with patient regarding imaging versus watchful waiting and symptomatic treatment right now.  Patient agrees with plan to have IV fluids, meclizine and CTA of his head and neck. Imaging shows apparent filling defect within the right transverse and sigmoid dural venous sinuses and right jugular bulb. While potentially artifactual on this arterially timed study, findings are concerning for dural venous sinus thrombosis and warrant MRI with and without contrast to further evaluate. Patient without evidence of large vessel occlusion or proximal hemodynamically significant stenosis in the head or neck with limited evaluation due to venous contamination and reflux of venous contrast in the left neck. Discussed results with patient. Headache currently 1/10 in severity. He feels dizzy when changing positions.   Consulted on day shift call neurologist Dr. Derry Lory who recommends proceeding with MRI brain and MRV head without contrast.  At this time we will hold off on anticoagulation and see what imaging shows.  Patient ordered ativan for the scan for anxiety.  MR brain and MRV head show no evidence of acute intracranial  abnormality.  No dural venous sinus thrombosis.  Radiologist did comment on diffuse T1 hypointensity of the visualized cervical spine area without discrete marrow replacing lesion to the nonspecific finding.  Discussed results with night shift on call neurologist Dr. Amada Jupiter who recommends treating for complex migraine with headache cocktail and discharge home as MR findings likely incidental and there are no signs of acute intracranial processes.  Discussed all results with patient.  He is tolerating p.o. intake here without any nausea or emesis.  He has no signs of vertigo after the meclizine earlier.  He is eager to be discharged home.  He is agreeable with plan of care.  Will have patient follow-up outpatient with neurology, ambulatory referral sent. Strict return precautions discussed.  Patient has tachycardia 110 documented heart rate just prior to discharge with tachypnea.  I have rechecked vitals myself and heart rate is ranging from 90-96 with normal respiratory rate.   Discussed HPI, physical exam and plan of care for this patient with attending Dr. Renaye Rakers. The attending physician evaluated this patient as part of a shared visit and agrees with plan of care.   Portions of this note were generated with Scientist, clinical (histocompatibility and immunogenetics). Dictation errors may occur despite best attempts at proofreading.   Final Clinical Impression(s) / ED Diagnoses Final diagnoses:  Vertigo  Non-intractable vomiting with nausea, unspecified vomiting type    Rx / DC Orders ED Discharge Orders          Ordered    meclizine (ANTIVERT) 12.5 MG tablet  3 times daily PRN        03/06/21 2129    Ambulatory referral to Neurology        03/06/21 2149             Shanon Ace, PA-C 03/06/21 2151    Liston Alba,  Mliss Sax 03/06/21 2242    Terald Sleeper, MD 03/06/21 [phone]

## 2021-03-06 NOTE — ED Notes (Signed)
Patient transported to MRI 

## 2021-04-10 ENCOUNTER — Ambulatory Visit (INDEPENDENT_AMBULATORY_CARE_PROVIDER_SITE_OTHER): Payer: No Typology Code available for payment source | Admitting: Neurology

## 2021-04-10 ENCOUNTER — Encounter: Payer: Self-pay | Admitting: Neurology

## 2021-04-10 ENCOUNTER — Ambulatory Visit: Payer: Self-pay | Admitting: Neurology

## 2021-04-10 VITALS — BP 121/81 | HR 76 | Ht 66.0 in | Wt 192.0 lb

## 2021-04-10 DIAGNOSIS — H8110 Benign paroxysmal vertigo, unspecified ear: Secondary | ICD-10-CM | POA: Diagnosis not present

## 2021-04-10 MED ORDER — MECLIZINE HCL 12.5 MG PO TABS: 12.5000 mg | ORAL_TABLET | Freq: Three times a day (TID) | ORAL | 0 refills | Status: AC | PRN

## 2021-04-10 NOTE — Progress Notes (Signed)
[facility]  PATIENT: Kevin Clayton DOB: 21-Mar-1991  REFERRING CLINICIAN: Namon Cirri E,* HISTORY FROM: Patient  REASON FOR VISIT: Dizziness   HISTORICAL  CHIEF COMPLAINT:  Chief Complaint  Patient presents with   New Patient (Initial Visit)    Rm 12, Vertigo started June 23rd, migraine followed after, along with nausea and vomiting, pt went to ED that same day, States this was the first time this happened, he was prescribed meclizine, dizziness lasted for 2 weeks, has not experienced anymore headaches/migraines. Today pt would like to discuss other treatments other than meclizine, states he will need this refilled. No PCP    HISTORY OF PRESENT ILLNESS:   This is a 30 year old man with past medical history of seasonal allergies who is presenting after ED visit for vertigo. He reports that on June 23, while asleep, he rolled over and had room spinning sensation. He went back to sleep but in the morning dizziness started, described as everything spinning around with nausea and vomiting. He went to work but room spinning sensation, nausea and vomiting continued, and he started to have headaches, therefore went to the ED. In the ED,  he was treated with Zofran, IV fluids,  and meclizine.  He did have a head CT and MRI and MRV brain which was all normal.  Patient reported his symptoms improved and he was discharged home with a 2-week course of meclizine to take as needed.  Patient reporting initially he was still having occasional dizziness and headaches but meclizine seems to help.  Patient finished the course of meclizine and has not had any episodes of dizziness or headache.  He denies any previous history of headaches, no family history of headaches denies any previous history of dizziness, never had these symptom in the past.  Patient reports 2 weeks prior to his presentation in the ED he was diagnosed with COVID-19 and he was symptomatic with cough  congestion.  At this time he does not have any additional complaints no additional concern his dizziness resolved and no additional headaches but would like a have Meclizine refill to use as needed.  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   REVIEW OF SYSTEMS: Full 14 system review of systems performed and negative with exception of: as noted in the HPI  ALLERGIES: Allergies  Allergen Reactions   Penicillins Hives    Has patient had a PCN reaction causing immediate rash, facial/tongue/throat swelling, SOB or lightheadedness with hypotension: yes Has patient had a PCN reaction causing severe rash involving mucus membranes or skin necrosis: no Has patient had a PCN reaction that required hospitalization: no Has patient had a PCN reaction occurring within the last 10 years: no If all of the above answers are "NO", then may proceed with Cephalosporin use.     HOME MEDICATIONS: Outpatient Medications Prior to Visit  Medication Sig Dispense Refill   loratadine (CLARITIN) 10 MG tablet Take 10 mg by mouth daily as needed for allergies.     meclizine (ANTIVERT) 12.5 MG tablet Take 1 tablet (12.5 mg total) by mouth 3 (three) times daily as needed for dizziness. 30 tablet 0   No facility-administered medications prior to visit.    PAST MEDICAL HISTORY: History reviewed. No pertinent past medical history.  PAST SURGICAL HISTORY: Past Surgical History:  Procedure Laterality Date   HERNIA REPAIR     HERNIA REPAIR     in highschool    FAMILY HISTORY: Family History  Problem Relation Age of Onset   Diabetes Maternal Grandmother     SOCIAL HISTORY: Social History   Socioeconomic History   Marital status: Married    Spouse name: Not on file   Number of children: Not on file   Years of education: Not on file   Highest education level: Not on file  Occupational History   Occupation: law firm  Tobacco Use   Smoking status: Never   Smokeless tobacco: Never   Substance and Sexual Activity   Alcohol use: Yes    Comment: occ   Drug use: No   Sexual activity: Not on file  Other Topics Concern   Not on file  Social History Narrative   Not on file   Social Determinants of Health   Financial Resource Strain: Not on file  Food Insecurity: Not on file  Transportation Needs: Not on file  Physical Activity: Not on file  Stress: Not on file  Social Connections: Not on file  Intimate Partner Violence: Not on file     PHYSICAL EXAM  GENERAL EXAM/CONSTITUTIONAL: Vitals:  Vitals:   04/10/21 0858  BP: 121/81  Pulse: 76  Weight: 192 lb (87.1 kg)  Height: 5\' 6"  (1.676 m)   Body mass index is 30.99 kg/m. Wt Readings from Last 3 Encounters:  04/10/21 192 lb (87.1 kg)  03/06/21 182 lb (82.6 kg)  03/30/17 170 lb (77.1 kg)   Patient is in no distress; well developed, nourished and groomed; neck is supple  CARDIOVASCULAR: Examination of carotid arteries is normal; no carotid bruits Regular rate and rhythm, no murmurs Examination of peripheral vascular system by observation and palpation is normal  EYES: Pupil round reactive to light, extra ocular movement intacts, no nystagmus. Visual field full to confrontation.   MUSCULOSKELETAL: Gait, strength, tone, movements noted in Neurologic exam below  NEUROLOGIC: MENTAL STATUS:  No flowsheet data found. awake, alert, oriented to person, place and time recent and remote memory intact normal attention and concentration language fluent, comprehension intact, naming intact fund of knowledge appropriate  CRANIAL NERVE:  2nd, 3rd, 4th, 6th - pupils equal and reactive to light, visual fields full to confrontation, extraocular muscles intact, no nystagmus 5th - facial sensation symmetric 7th - facial strength symmetric 8th - hearing intact 9th - palate elevates symmetrically, uvula midline 11th - shoulder shrug symmetric 12th - tongue protrusion midline  MOTOR:  normal bulk and tone,  full strength in the BUE, BLE  SENSORY:  normal and symmetric to light touch, pinprick, temperature, vibration  COORDINATION:  finger-nose-finger, fine finger movements normal  REFLEXES:  deep tendon reflexes present and symmetric  GAIT/STATION:  normal   DIAGNOSTIC DATA (LABS, IMAGING, TESTING) - I reviewed patient records, labs, notes, testing and imaging myself where available.  Lab Results  Component Value Date   WBC 7.5 03/06/2021   HGB 15.7 03/06/2021   HCT 49.0 03/06/2021   MCV 81.1 03/06/2021   PLT 382 03/06/2021      Component Value Date/Time   NA 138 03/06/2021 1034   K 3.6 03/06/2021 1034   CL 105 03/06/2021 1034   CO2 20 (L) 03/06/2021 1034   GLUCOSE 136 (H) 03/06/2021 1034   BUN 15 03/06/2021 1034   CREATININE 1.19 03/06/2021 1034   CALCIUM 9.5 03/06/2021 1034   PROT 8.2 (H) 03/30/2017 0054   ALBUMIN 4.6 03/30/2017 0054   AST 26 03/30/2017 0054   ALT 44 03/30/2017 0054   ALKPHOS 46 03/30/2017 0054   BILITOT 0.4 03/30/2017  0054   GFRNONAA >60 03/06/2021 1034   GFRAA >60 03/30/2017 0054   No results found for: CHOL, HDL, LDLCALC, LDLDIRECT, TRIG, CHOLHDL No results found for: RKYH0W No results found for: VITAMINB12 No results found for: TSH  CT, MRI, MRV reviewed:  1. No evidence of acute intracranial abnormality.    ASSESSMENT AND PLAN  30 y.o. year old male with the past medical history of seasonal allergies who is presenting for 1 episode of dizziness and headache.  He describe d dizziness as room spinning sensation which is now  resolved after administration of fluids Zofran and meclizine in the ED.  He never had these symptoms in the past and since finishing his course of meclizine he does not have any additional dizziness or headaches.  Patient reported that while he was symptomatic, movement of his head will make his symptoms worse.  It is more likely than not that he had a benign paroxysmal positional vertigo.  At this point no additional  tests needed, MRI Brain was normal,  his symptoms are not resolved and  I prescribed him additional meclizine to take as needed.  Advised patient to call us if symptoms return   1. Benign paroxysmal positional vertigo, unspecified laterality      PLAN: Take meclizine as needed for dizziness  Drink plenty of fluid  Call the office if symptoms worsen    No orders of the defined types were placed in this encounter.   Meds ordered this encounter  Medications   meclizine (ANTIVERT) 12.5 MG tablet    Sig: Take 1 tablet (12.5 mg total) by mouth 3 (three) times daily as needed for up to 10 doses for dizziness.    Dispense:  10 tablet    Refill:  0     Return if symptoms worsen or fail to improve.    Windell Norfolk, MD 04/10/2021, 9:37 AM  [facility] [address], [address] [place], [place] [address] [phone]

## 2021-04-10 NOTE — Patient Instructions (Signed)
Take meclizine as needed for dizziness  Drink plenty of fluid  Call the office if symptoms worsen

## 2021-04-17 ENCOUNTER — Ambulatory Visit: Payer: Self-pay | Admitting: Neurology

## 2022-12-13 IMAGING — CT CT ANGIO HEAD
1 of 11 series · 14 of 47 positions shown · IV contrast (OMNI)
Comparison: None.

CLINICAL DATA: Dizziness.

EXAM:
CT ANGIOGRAPHY HEAD AND NECK
TECHNIQUE: Multidetector CT imaging of the head and neck was performed using
the standard protocol during bolus administration of intravenous
contrast. Multiplanar CT image reconstructions and MIPs were
obtained to evaluate the vascular anatomy. Carotid stenosis
measurements (when applicable) are obtained utilizing NASCET
criteria, using the distal internal carotid diameter as the
denominator.
CONTRAST:  75mL OMNIPAQUE IOHEXOL 350 MG/ML SOLN

[Series 10: thin · axial · 0.45mm/px · z∈[-302,-13]mm · 14 of 668 slices shown]
[im 45/668  brain]
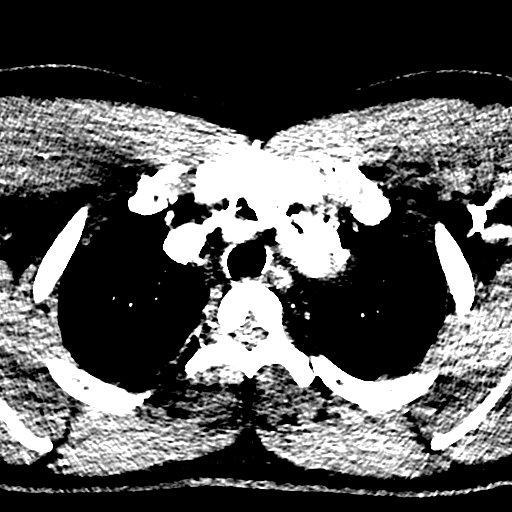
[im 89/668  bone]
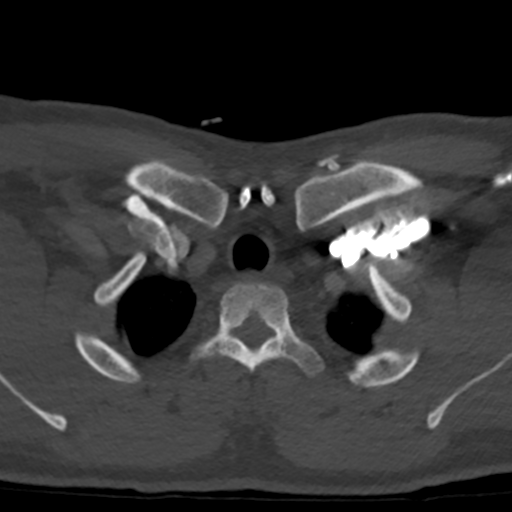
[im 134/668  brain]
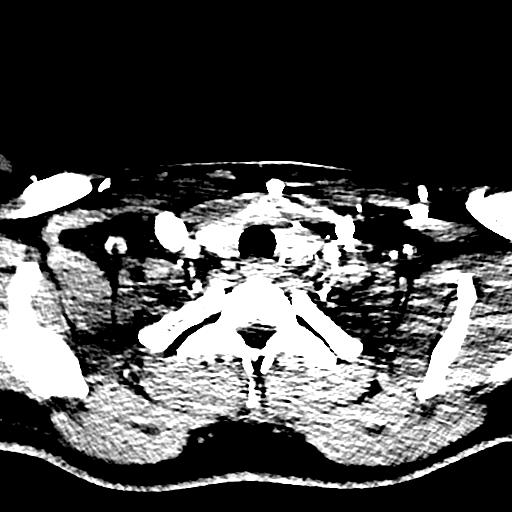
[im 178/668  bone]
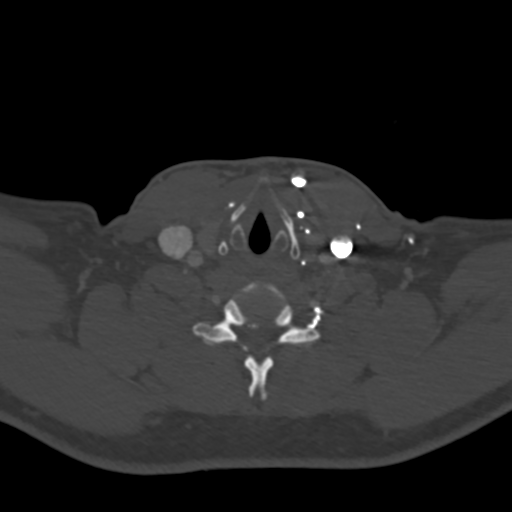
[im 223/668  brain]
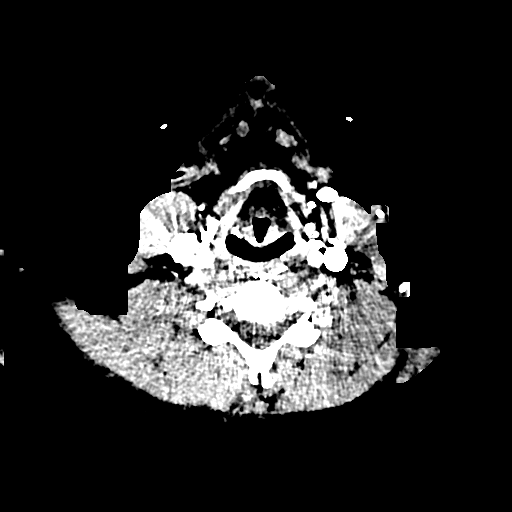
[im 267/668  bone]
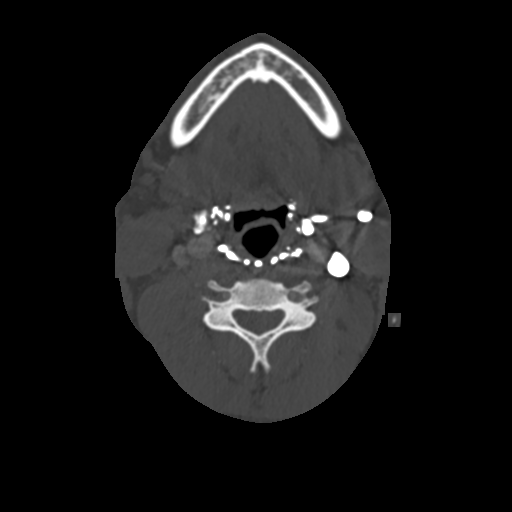
[im 312/668  brain]
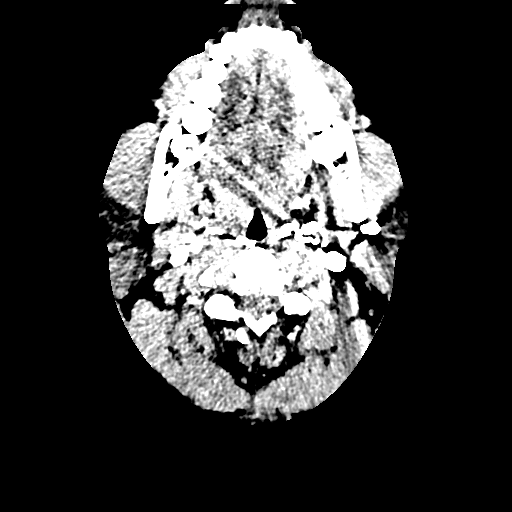
[im 356/668  bone]
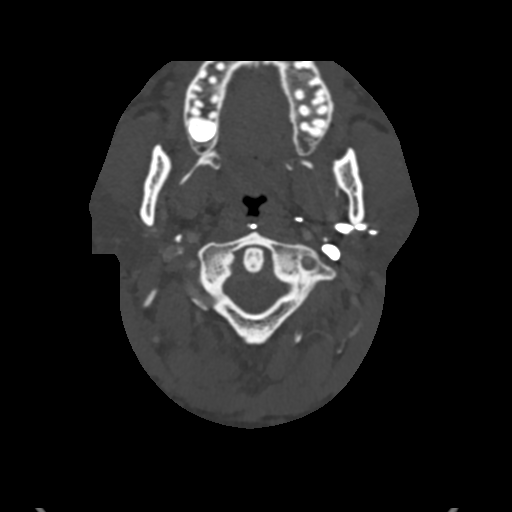
[im 401/668  brain]
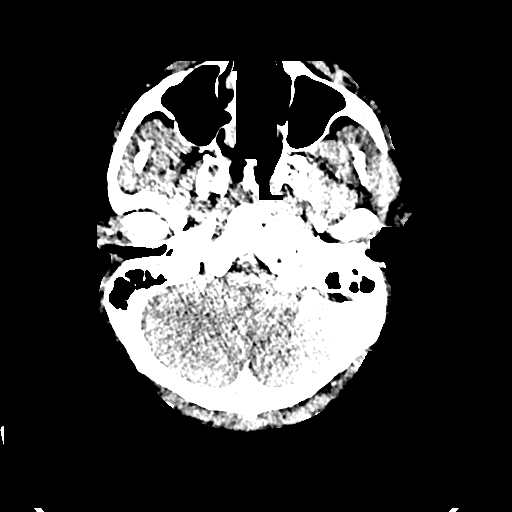
[im 445/668  bone]
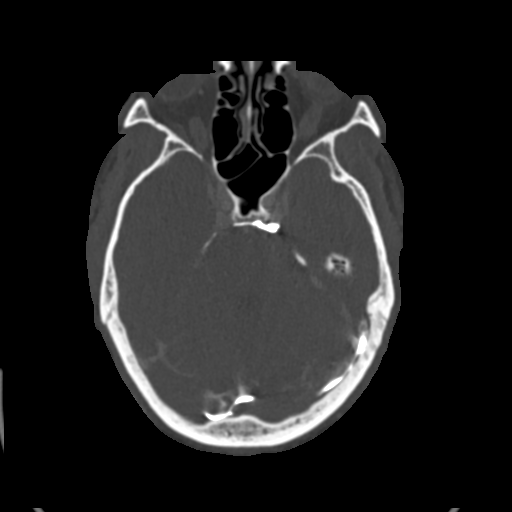
[im 490/668  brain]
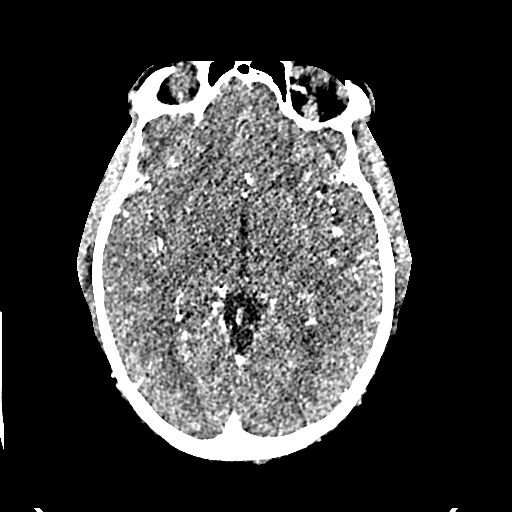
[im 534/668  bone]
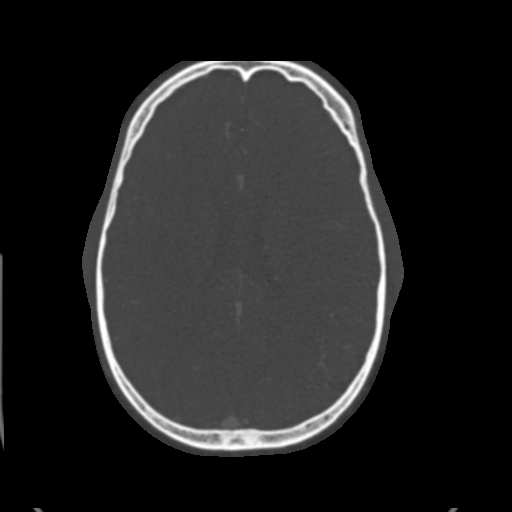
[im 579/668  brain]
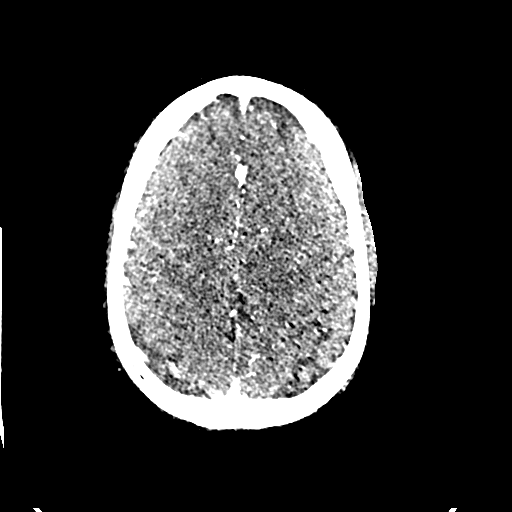
[im 623/668  bone]
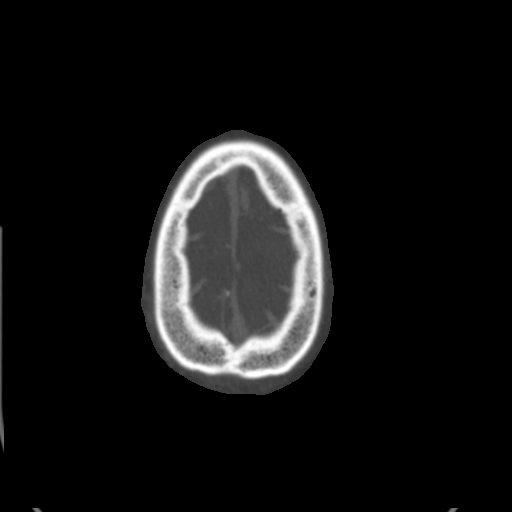

[14 of 47 positions shown; findings below may reference images not displayed]

FINDINGS: CT HEAD FINDINGS

Brain: No evidence of acute large vascular territory infarction,
hemorrhage, hydrocephalus, extra-axial collection or mass
lesion/mass effect.

Vascular: See below.

Skull: No acute fracture. Nonspecific scalp soft tissue nodule along
the posterior right paramidline vertex.

Sinuses: Visualized sinuses are largely clear.

Orbits: No acute finding.

Review of the MIP images confirms the above findings

CTA NECK FINDINGS

Evaluation is limited due to pooling of contrast in the left
subclavian vein and reflux of contrast in the left neck with
associated streak artifact. Suspected narrowing of the subclavian
vein at midline.

Aortic arch: Great vessel origins appear patent. Evaluation is
limited due to streak artifact from pooled contrast within adjacent
veins.

Right carotid system: No evidence of dissection, stenosis (50% or
greater) or occlusion.

Left carotid system: Limited evaluation for the reasons described
above. No visible hemodynamically significant (greater than 50%)
stenosis.

Vertebral arteries: No visible significant (greater than 50%)
stenosis.

Skeleton: No acute abnormality. Reversal of the normal cervical
lordosis.

Other neck: No acute abnormality.

Upper chest: Visualized lung apices are clear.

Review of the MIP images confirms the above findings

CTA HEAD FINDINGS

Anterior circulation: Bilateral intracranial ICAs are patent.
Bilateral MCAs and ACAs are patent without evidence of proximal
hemodynamically significant stenosis. Distal vascular evaluation is
limited due to venous contamination.

Posterior circulation: Bilateral intradural vertebral arteries are
patent. The basilar artery and bilateral posterior cerebral arteries
are patent proximally. Right fetal like PCA with posterior
communicating artery. No proximal hemodynamically significant
stenosis. Limited evaluation of the distal PCAs due to venous
contamination.

Venous sinuses: There is apparent filling defect within the right
transverse and sigmoid sinuses and right jugular bulb, which is
suboptimally evaluated but suspicious for dural venous sinus
thrombosis.

Review of the MIP images confirms the above findings
IMPRESSION: 1. Apparent filling defect within the right transverse and sigmoid
dural venous sinuses and right jugular bulb. While potentially
artifactual on this arterially timed study, findings are concerning
for dural venous sinus thrombosis and warrant MRI with and without
contrast to further evaluate.
2. No evidence of large vessel occlusion or proximal hemodynamically
significant stenosis in the head or neck with limited evaluation due
to venous contamination and reflux of venous contrast in the left
neck.

Findings discussed with at [REDACTED] via telephone at [DATE]
p.m.
